# Patient Record
Sex: Female | Born: 1995 | Race: Black or African American | Hispanic: No | Marital: Single | Smoking: Never smoker
Health system: Southern US, Community
[De-identification: ages and names within clinical notes are randomized; demographics above are authoritative.]

## PROBLEM LIST (undated history)

## (undated) DIAGNOSIS — F419 Anxiety disorder, unspecified: Secondary | ICD-10-CM

---

## 2016-11-23 ENCOUNTER — Encounter (HOSPITAL_COMMUNITY): Payer: Self-pay | Admitting: *Deleted

## 2016-11-23 ENCOUNTER — Emergency Department (HOSPITAL_COMMUNITY)
Admission: EM | Admit: 2016-11-23 | Discharge: 2016-11-23 | Disposition: A | Discharge status: Home / Self Care | Payer: 59 | Attending: Emergency Medicine | Admitting: Emergency Medicine

## 2016-11-23 ENCOUNTER — Emergency Department (HOSPITAL_COMMUNITY): Payer: 59

## 2016-11-23 DIAGNOSIS — R05 Cough: Secondary | ICD-10-CM | POA: Diagnosis present

## 2016-11-23 DIAGNOSIS — J069 Acute upper respiratory infection, unspecified: Secondary | ICD-10-CM | POA: Diagnosis not present

## 2016-11-23 DIAGNOSIS — B9789 Other viral agents as the cause of diseases classified elsewhere: Secondary | ICD-10-CM

## 2016-11-23 HISTORY — DX: Anxiety disorder, unspecified: F41.9

## 2016-11-23 LAB — RAPID STREP SCREEN (MED CTR MEBANE ONLY): STREPTOCOCCUS, GROUP A SCREEN (DIRECT): NEGATIVE

## 2016-11-23 LAB — I-STAT CHEM 8, ED
BUN: 3 mg/dL — AB (ref 6–20)
CHLORIDE: 104 mmol/L (ref 101–111)
CREATININE: 0.9 mg/dL (ref 0.44–1.00)
Calcium, Ion: 1.04 mmol/L — ABNORMAL LOW (ref 1.15–1.40)
Glucose, Bld: 95 mg/dL (ref 65–99)
HEMATOCRIT: 34 % — AB (ref 36.0–46.0)
Hemoglobin: 11.6 g/dL — ABNORMAL LOW (ref 12.0–15.0)
Potassium: 3.3 mmol/L — ABNORMAL LOW (ref 3.5–5.1)
SODIUM: 138 mmol/L (ref 135–145)
TCO2: 22 mmol/L (ref 0–100)

## 2016-11-23 LAB — I-STAT BETA HCG BLOOD, ED (MC, WL, AP ONLY): I-stat hCG, quantitative: <5 m[IU]/mL (ref <5)

## 2016-11-23 LAB — I-STAT TROPONIN, ED: Troponin i, poc: 0 ng/mL (ref 0.00–0.08)

## 2016-11-23 MED ORDER — IBUPROFEN 200 MG PO TABS
600.0000 mg | ORAL_TABLET | Freq: Once | ORAL | Status: AC
  Administered 2016-11-23: 600 mg via ORAL
  Filled 2016-11-23: qty 3

## 2016-11-23 NOTE — ED Notes (Signed)
Patient transported to X-ray 

## 2016-11-23 NOTE — ED Notes (Signed)
Bed: WA10 Expected date:  Expected time:  Means of arrival:  Comments: 

## 2016-11-23 NOTE — ED Notes (Signed)
ED Provider at bedside. 

## 2016-11-23 NOTE — ED Triage Notes (Signed)
Brought in by ems for cough x 3 days , Anxiety on arrival

## 2016-11-23 NOTE — ED Provider Notes (Signed)
[facility]-EMERGENCY DEPT Provider Note   CSN: [id number] Arrival date & time: 11/23/16  1011     History   Chief Complaint Chief Complaint  Patient presents with  . Cough    HPI Codi Folkerts is a 21 y.o. female.  The history is provided by the patient. No language interpreter was used.  Cough    Maurisha Mongeau is a 21 y.o. female who presents to the Emergency Department complaining of cough.  She presents via EMS for evaluation of cough for the last 3-4 days. She has cough and sore throat with chest tightness. She had one episode of emesis this morning. No reports of fevers. Per EMS she developed severe shortness of breath and then anxiety attack with contractures of her hands and she was placed on a nonrebreather. She reports history of anxiety twice in the past. She does take oral contraceptives. She is a nonsmoker and no history of DVT or PE. She denies any known sick contacts. Past Medical History:  Diagnosis Date  . Anxiety     There are no active problems to display for this patient.   History reviewed. No pertinent surgical history.  OB History    No data available       Home Medications    Prior to Admission medications   Medication Sig Start Date End Date Taking? Authorizing Provider  acetaminophen (TYLENOL) 500 MG tablet Take 1,000 mg by mouth every 6 (six) hours as needed for mild pain or moderate pain.   Yes Historical Provider, MD  Phenylephrine-Pheniramine-DM [facility] COLD & COUGH PO) Take 15 mLs by mouth daily as needed (for cold symptoms).   Yes Historical Provider, MD  SPRINTEC 28 0.25-35 MG-MCG tablet  10/03/16  Yes Historical Provider, MD    Family History No family history on file.  Social History Social History  Substance Use Topics  . Smoking status: Never Smoker  . Smokeless tobacco: Not on file  . Alcohol use No     Allergies   Patient has no known allergies.   Review of Systems Review of Systems  Respiratory: Positive for  cough.   All other systems reviewed and are negative.    Physical Exam Updated Vital Signs BP 130/84 (BP Location: Left Arm)   Pulse 75   Temp 99 F (37.2 C) (Oral)   Resp 18   Ht 5' (1.524 m)   Wt 115 lb (52.2 kg)   LMP 11/07/2016   SpO2 100%   BMI 22.46 kg/m   Physical Exam  Constitutional: She is oriented to person, place, and time. She appears well-developed and well-nourished.  HENT:  Head: Normocephalic and atraumatic.  Mouth/Throat: Oropharynx is clear and moist.  Cardiovascular: Normal rate and regular rhythm.   No murmur heard. Pulmonary/Chest: Effort normal and breath sounds normal. No stridor. No respiratory distress.  Abdominal: Soft. There is no tenderness. There is no rebound and no guarding.  Musculoskeletal: She exhibits no edema or tenderness.  Neurological: She is alert and oriented to person, place, and time.  Skin: Skin is warm and dry.  Psychiatric:  Anxious and tearful, poor eye contact  Nursing note and vitals reviewed.    ED Treatments / Results  Labs (all labs ordered are listed, but only abnormal results are displayed) Labs Reviewed  I-STAT CHEM 8, ED - Abnormal; Notable for the following:       Result Value   Potassium 3.3 (*)    BUN 3 (*)    Calcium, Ion 1.04 (*)  Hemoglobin 11.6 (*)    HCT 34.0 (*)    All other components within normal limits  RAPID STREP SCREEN (NOT AT [facility])  CULTURE, GROUP A STREP ([facility])  I-STAT BETA HCG BLOOD, ED ([facility], [facility], [facility] ONLY)  I-STAT TROPOININ, ED    EKG  EKG Interpretation None       Radiology Dg Chest 2 View  Result Date: 11/23/2016 CLINICAL DATA:  Cough for 3 days.  Fever. EXAM: CHEST  2 VIEW COMPARISON:  None. FINDINGS: S-shaped thoracolumbar spine curvature. Midline trachea. Normal heart size and mediastinal contours. No pleural effusion or pneumothorax. Clear lungs. IMPRESSION: No acute cardiopulmonary disease. Electronically Signed   By: Jeronimo Greaves M.D.   On: 11/23/2016 11:26     Procedures Procedures (including critical care time)  Medications Ordered in ED Medications  ibuprofen (ADVIL,MOTRIN) tablet 600 mg (600 mg Oral Given 11/23/16 1147)     Initial Impression / Assessment and Plan / ED Course  I have reviewed the triage vital signs and the nursing notes.  Pertinent labs & imaging results that were available during my care of the patient were reviewed by me and considered in my medical decision making (see chart for details).     Patient here for evaluation of cough, sore throat. She is anxious on examination with no respiratory distress. No evidence of RPA, PTA, epiglotitis, pneumonia. Current clinical picture is not consistent with PE. Counseled pt on home care for viral URI, anxiety. Discussed outpatient follow up and return precautions.  Final Clinical Impressions(s) / ED Diagnoses   Final diagnoses:  Viral URI with cough    New Prescriptions Discharge Medication List as of 11/23/2016  1:25 PM       Tilden Fossa, MD 11/23/16 1729

## 2016-11-26 LAB — CULTURE, GROUP A STREP (THRC)

## 2017-12-14 IMAGING — CR DG CHEST 2V
3 series · 3 of 3 positions shown · non-contrast
Comparison: None.

CLINICAL DATA: Cough for 3 days.  Fever.

EXAM:
CHEST  2 VIEW

[w chest lat]
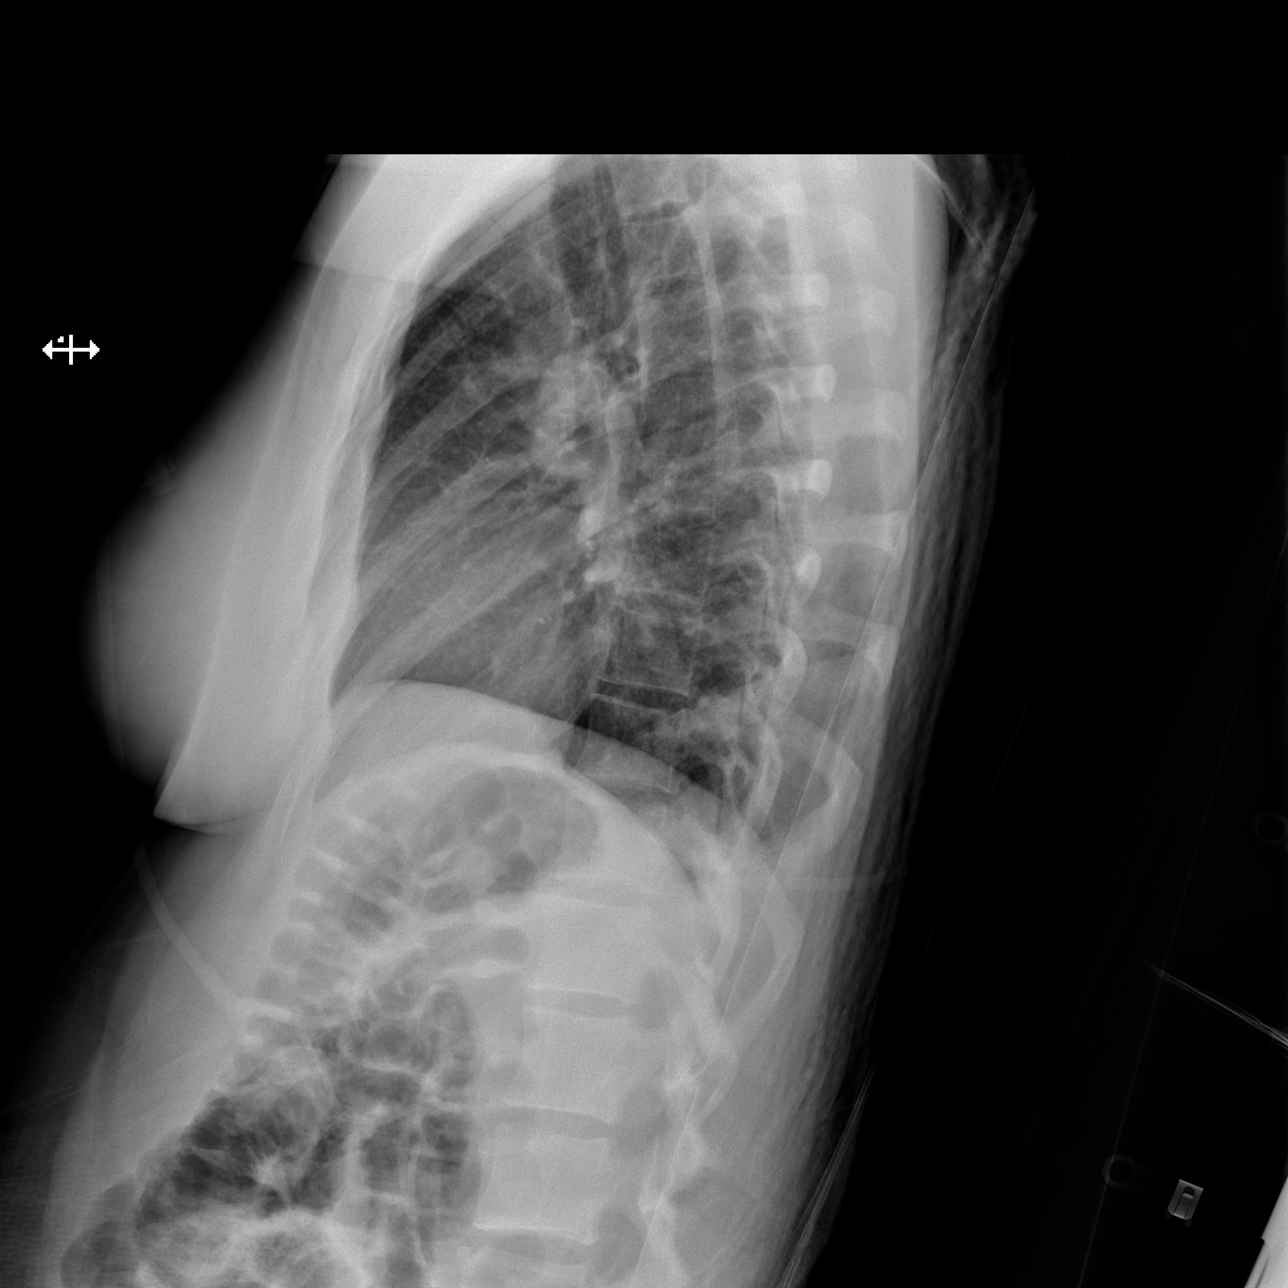

[x chest ap (1 of 2)]
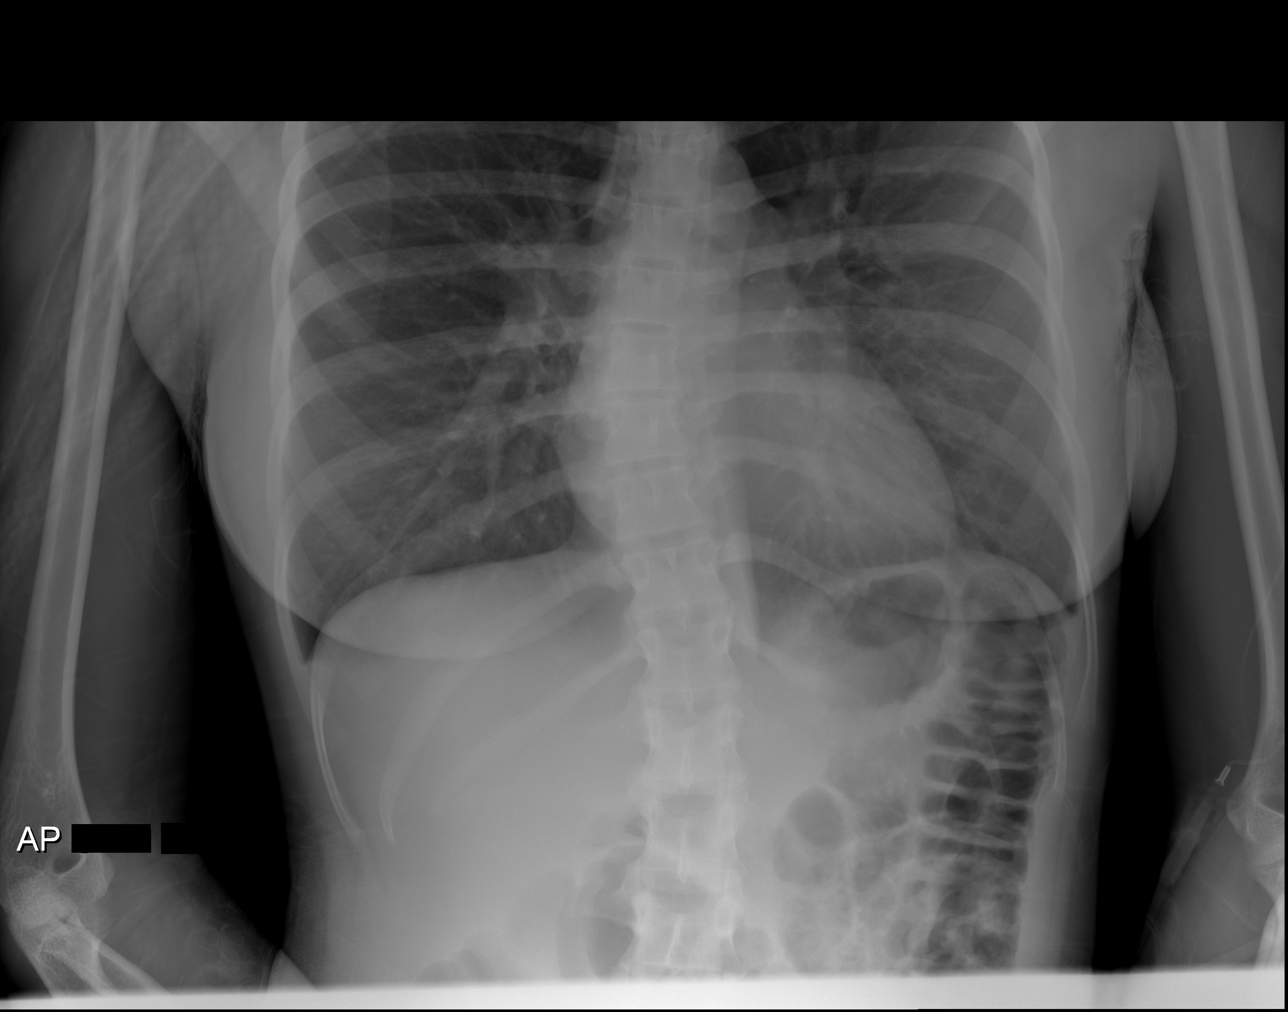

[x chest ap (2 of 2)]
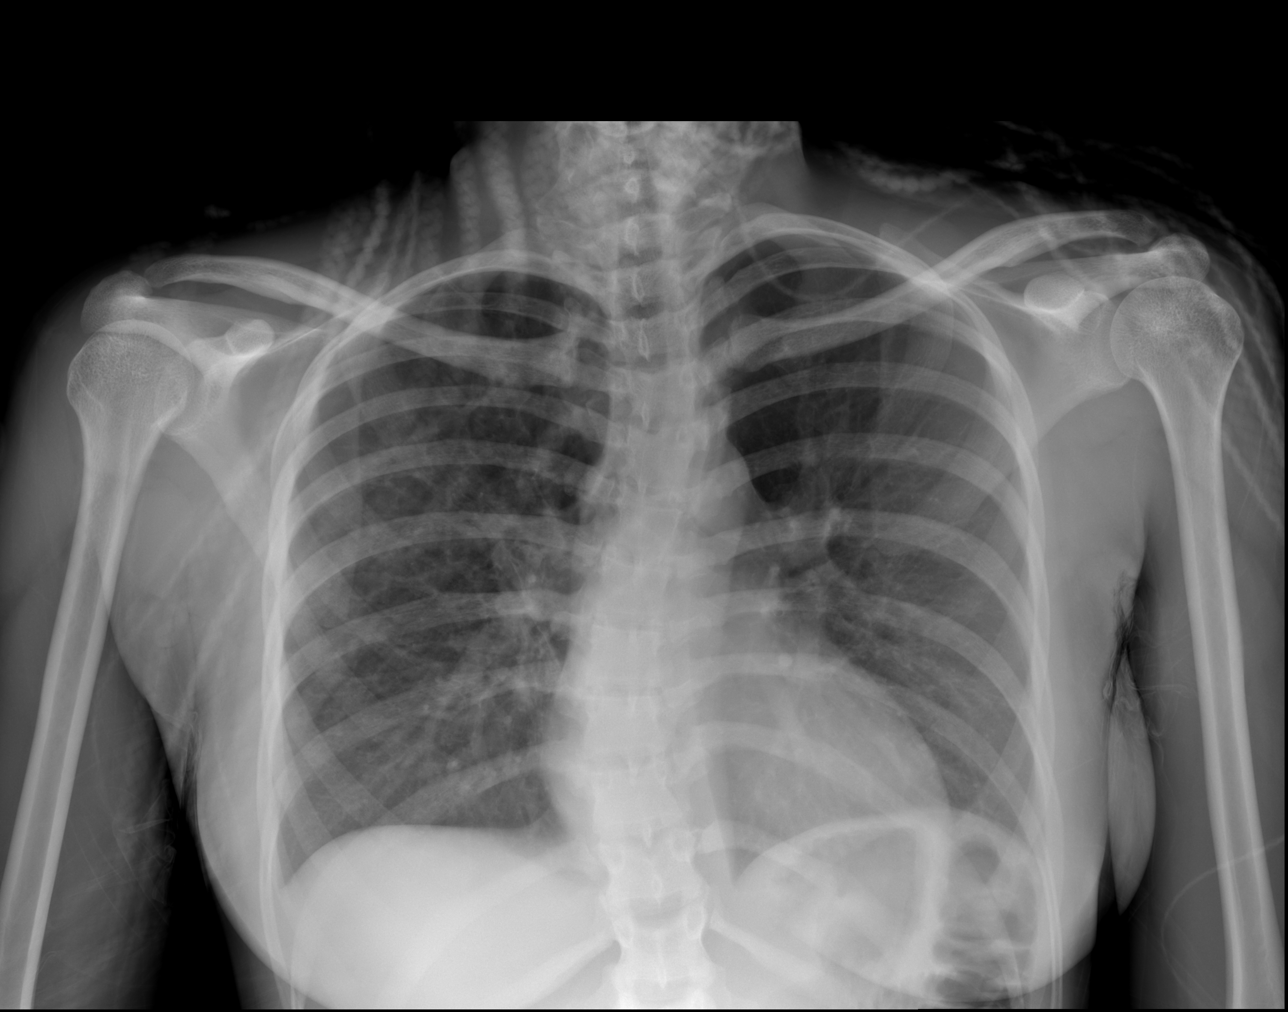

[3 of 3 positions shown; findings below may reference images not displayed]

FINDINGS: S-shaped thoracolumbar spine curvature. Midline trachea. Normal
heart size and mediastinal contours. No pleural effusion or
pneumothorax. Clear lungs.
IMPRESSION: No acute cardiopulmonary disease.

## 2019-05-26 ENCOUNTER — Other Ambulatory Visit: Payer: Self-pay

## 2019-05-26 ENCOUNTER — Inpatient Hospital Stay (HOSPITAL_COMMUNITY)
Admission: AD | Admit: 2019-05-26 | Discharge: 2019-05-27 | Disposition: A | Discharge status: Home / Self Care | Payer: Commercial Managed Care - PPO | Attending: Emergency Medicine | Admitting: Emergency Medicine

## 2019-05-26 DIAGNOSIS — R1032 Left lower quadrant pain: Secondary | ICD-10-CM | POA: Insufficient documentation

## 2019-05-26 DIAGNOSIS — Z5321 Procedure and treatment not carried out due to patient leaving prior to being seen by health care provider: Secondary | ICD-10-CM | POA: Diagnosis not present

## 2019-05-26 DIAGNOSIS — B009 Herpesviral infection, unspecified: Secondary | ICD-10-CM | POA: Diagnosis present

## 2019-05-26 DIAGNOSIS — R102 Pelvic and perineal pain: Secondary | ICD-10-CM

## 2019-05-26 LAB — COMPREHENSIVE METABOLIC PANEL
ALT: 15 U/L (ref 0–44)
AST: 15 U/L (ref 15–41)
Albumin: 3.9 g/dL (ref 3.5–5.0)
Alkaline Phosphatase: 63 U/L (ref 38–126)
Anion gap: 9 (ref 5–15)
BUN: 9 mg/dL (ref 6–20)
CO2: 24 mmol/L (ref 22–32)
Calcium: 9.4 mg/dL (ref 8.9–10.3)
Chloride: 103 mmol/L (ref 98–111)
Creatinine, Ser: 0.74 mg/dL (ref 0.44–1.00)
GFR calc Af Amer: >60 mL/min (ref >60)
GFR calc non Af Amer: >60 mL/min (ref >60)
Glucose, Bld: 85 mg/dL (ref 70–99)
Potassium: 3.7 mmol/L (ref 3.5–5.1)
Sodium: 136 mmol/L (ref 135–145)
Total Bilirubin: 0.4 mg/dL (ref 0.3–1.2)
Total Protein: 7 g/dL (ref 6.5–8.1)

## 2019-05-26 LAB — CBC
HCT: 36.5 % (ref 36.0–46.0)
Hemoglobin: 11.5 g/dL — ABNORMAL LOW (ref 12.0–15.0)
MCH: 27.8 pg (ref 26.0–34.0)
MCHC: 31.5 g/dL (ref 30.0–36.0)
MCV: 88.2 fL (ref 80.0–100.0)
Platelets: 271 10*3/uL (ref 150–400)
RBC: 4.14 MIL/uL (ref 3.87–5.11)
RDW: 12.4 % (ref 11.5–15.5)
WBC: 8.4 10*3/uL (ref 4.0–10.5)
nRBC: 0 % (ref 0.0–0.2)

## 2019-05-26 LAB — LIPASE, BLOOD: Lipase: 34 U/L (ref 11–51)

## 2019-05-26 LAB — POCT PREGNANCY, URINE: Preg Test, Ur: NEGATIVE

## 2019-05-26 NOTE — MAU Provider Note (Cosign Needed)
First Provider Initiated Contact with Patient 05/26/19 2028     S Ms. Cassie Shedlock is a 23 y.o. No obstetric history on file. non-pregnant female who presents to [facility]U today with complaint of abdominal pain. She reports that the abdominal pain started occurring 4 hours ago- never had this pain prior to it starting today. She reports the pain is specific to the left lower quadrant in pelvic region of left lymph node- describes as dull ache, rates pain 9/10- has not taken any medication for abdominal pain. Pain radiates around to back. Denies vaginal bleeding, itching, discharge or urinary symptoms.    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   O BP 121/67   Pulse 85   Temp 98.2 F (36.8 C)   Resp 19   Wt 59.9 kg   BMI 25.80 kg/m  Physical Exam  Nursing note and vitals reviewed. Constitutional: She is oriented to person, place, and time. She appears well-developed and well-nourished. She appears distressed.  Patient leaning to right d/t pain  Cardiovascular: Normal rate and regular rhythm.  Respiratory: Effort normal and breath sounds normal.  GI: Soft. She exhibits no distension. There is abdominal tenderness. There is no rebound and no guarding.  Left pelvic lymph node tender and swollen  Neurological: She is alert and oriented to person, place, and time.  Psychiatric: She has a normal mood and affect. Her behavior is normal. Thought content normal.   A Non pregnant female Medical screening exam complete 1. Pelvic pain in female   2. HSV (herpes simplex virus) infection     P Discharge from MAU in stable condition Offered patient Rx for acyclovir for HSV outbreak and follow up in the office this week or transfer to [facility] for further evaluation.  Patient wants to go to [facility] for further evaluation to "make sure it is not anything else"  Warning signs for worsening condition that would warrant emergency follow-up  discussed Patient may return to MAU as needed for pregnancy related complaints  Lajean Manes, CNM 05/26/2019 8:44 PM

## 2019-05-26 NOTE — MAU Note (Addendum)
Started having left lower abdominal pain starting 4 hours ago-started as dull and now has become so intense she can't sit down.  No VB.  Hasn't taken anything for the pain.  Reports she has irregular periods.  LMP 9/27 but it was 2 days long.  Had a negative HPT in September- no tests since then.

## 2019-05-26 NOTE — Discharge Instructions (Signed)
Pelvic Pain, Female Pelvic pain is pain in your lower belly (abdomen), below your belly button and between your hips. The pain may start suddenly (be acute), keep coming back (be recurring), or last a long time (become chronic). Pelvic pain that lasts longer than 6 months is called chronic pelvic pain. There are many causes of pelvic pain. Sometimes the cause of pelvic pain is not known. Follow these instructions at home:   Take over-the-counter and prescription medicines only as told by your doctor.  Rest as told by your doctor.  Do not have sex if it hurts.  Keep a journal of your pelvic pain. Write down: ? When the pain started. ? Where the pain is located. ? What seems to make the pain better or worse, such as food or your period (menstrual cycle). ? Any symptoms you have along with the pain.  Keep all follow-up visits as told by your doctor. This is important. Contact a doctor if:  Medicine does not help your pain.  Your pain comes back.  You have new symptoms.  You have unusual discharge or bleeding from your vagina.  You have a fever or chills.  You are having trouble pooping (constipation).  You have blood in your pee (urine) or poop (stool).  Your pee smells bad.  You feel weak or light-headed. Get help right away if:  You have sudden pain that is very bad.  Your pain keeps getting worse.  You have very bad pain and also have any of these symptoms: ? A fever. ? Feeling sick to your stomach (nausea). ? Throwing up (vomiting). ? Being very sweaty.  You pass out (lose consciousness). Summary  Pelvic pain is pain in your lower belly (abdomen), below your belly button and between your hips.  There are many possible causes of pelvic pain.  Keep a journal of your pelvic pain. This information is not intended to replace advice given to you by your health care provider. Make sure you discuss any questions you have with your health care provider. Document  Released: 01/14/2008 Document Revised: 01/13/2018 Document Reviewed: 01/13/2018 Elsevier Patient Education  2020 [employer]  

## 2019-05-27 NOTE — ED Notes (Signed)
Patient called for room multiple times, not seen in lobby.
